# Patient Record
Sex: Female | Born: 1985 | Race: White | Hispanic: No | Marital: Married | Smoking: Never smoker
Health system: Southern US, Community
[De-identification: ages and names within clinical notes are randomized; demographics above are authoritative.]

## PROBLEM LIST (undated history)

## (undated) HISTORY — PX: ABDOMINAL HYSTERECTOMY: SHX81

---

## 2018-06-11 ENCOUNTER — Ambulatory Visit (INDEPENDENT_AMBULATORY_CARE_PROVIDER_SITE_OTHER): Payer: BLUE CROSS/BLUE SHIELD

## 2018-06-11 ENCOUNTER — Ambulatory Visit (HOSPITAL_COMMUNITY)
Admission: EM | Admit: 2018-06-11 | Discharge: 2018-06-11 | Disposition: A | Discharge status: Home / Self Care | Payer: BLUE CROSS/BLUE SHIELD | Attending: Family Medicine | Admitting: Family Medicine

## 2018-06-11 ENCOUNTER — Encounter (HOSPITAL_COMMUNITY): Payer: Self-pay | Admitting: Emergency Medicine

## 2018-06-11 DIAGNOSIS — S7001XA Contusion of right hip, initial encounter: Secondary | ICD-10-CM | POA: Diagnosis not present

## 2018-06-11 DIAGNOSIS — S42401A Unspecified fracture of lower end of right humerus, initial encounter for closed fracture: Secondary | ICD-10-CM

## 2018-06-11 DIAGNOSIS — S52124A Nondisplaced fracture of head of right radius, initial encounter for closed fracture: Secondary | ICD-10-CM | POA: Diagnosis not present

## 2018-06-11 DIAGNOSIS — S5001XA Contusion of right elbow, initial encounter: Secondary | ICD-10-CM | POA: Diagnosis not present

## 2018-06-11 DIAGNOSIS — W0110XA Fall on same level from slipping, tripping and stumbling with subsequent striking against unspecified object, initial encounter: Secondary | ICD-10-CM | POA: Diagnosis not present

## 2018-06-11 DIAGNOSIS — W19XXXA Unspecified fall, initial encounter: Secondary | ICD-10-CM

## 2018-06-11 MED ORDER — IBUPROFEN 800 MG PO TABS: 800.0000 mg | ORAL_TABLET | Freq: Three times a day (TID) | ORAL | 0 refills | Status: AC

## 2018-06-11 MED ORDER — HYDROCODONE-ACETAMINOPHEN 5-325 MG PO TABS: 1.0000 | ORAL_TABLET | ORAL | 0 refills | Status: AC | PRN

## 2018-06-11 NOTE — ED Provider Notes (Signed)
[facility]    CSN: [id number] Arrival date & time: 06/11/18  1025     History   Chief Complaint Chief Complaint  Patient presents with  . Fall    HPI Dorothy Fields is a 32 y.o. female.   HPI  Tripped and fell last night.  She was walking on uneven ground.  She landed back towards her right side.  She had a right buttock and her right elbow forcibly.  She has some bruising on her buttock and pain.  She can walk with a normal gait.  She has a severely painful elbow.  She holds it up close to her body.  She resists any movement.  This is also bruised and swollen.  No prior fractures.  No prior back problems.  History reviewed. No pertinent past medical history.  There are no active problems to display for this patient.   Past Surgical History:  Procedure Laterality Date  . ABDOMINAL HYSTERECTOMY      OB History   None      Home Medications    Prior to Admission medications   Medication Sig Start Date End Date Taking? Authorizing Provider  HYDROcodone-acetaminophen (NORCO/VICODIN) 5-325 MG tablet Take 1-2 tablets by mouth every 4 (four) hours as needed. 06/11/18   Eustace Moore, MD  ibuprofen (ADVIL,MOTRIN) 800 MG tablet Take 1 tablet (800 mg total) by mouth 3 (three) times daily. 06/11/18   Eustace Moore, MD    Family History Family History  Problem Relation Age of Onset  . Healthy Mother     Social History Social History   Tobacco Use  . Smoking status: Never Smoker  . Smokeless tobacco: Never Used  Substance Use Topics  . Alcohol use: Not Currently  . Drug use: Never     Allergies   Patient has no known allergies.   Review of Systems Review of Systems  Constitutional: Negative for chills and fever.  HENT: Negative for ear pain and sore throat.   Eyes: Negative for pain and visual disturbance.  Respiratory: Negative for cough and shortness of breath.   Cardiovascular: Negative for chest pain and palpitations.    Gastrointestinal: Negative for abdominal pain and vomiting.  Genitourinary: Negative for dysuria and hematuria.  Musculoskeletal: Positive for arthralgias and back pain.       Pain in elbow and hip from fall  Skin: Negative for color change and rash.  Neurological: Negative for seizures and syncope.  All other systems reviewed and are negative.    Physical Exam Triage Vital Signs ED Triage Vitals [06/11/18 1125]  Enc Vitals Group     BP (!) 143/94     Pulse Rate 83     Resp 18     Temp 98.2 F (36.8 C)     Temp Source Oral     SpO2 100 %     Weight      Height      Head Circumference      Peak Flow      Pain Score 8     Pain Loc      Pain Edu?      Excl. in GC?    No data found.  Updated Vital Signs BP (!) 143/94 (BP Location: Left Arm)   Pulse 83   Temp 98.2 F (36.8 C) (Oral)   Resp 18   SpO2 100%   Visual Acuity Right Eye Distance:   Left Eye Distance:   Bilateral Distance:  Right Eye Near:   Left Eye Near:    Bilateral Near:     Physical Exam  Constitutional: She appears well-developed and well-nourished. No distress.  Appears uncomfortable  HENT:  Head: Normocephalic and atraumatic.  Mouth/Throat: Oropharynx is clear and moist.  Eyes: Pupils are equal, round, and reactive to light. Conjunctivae are normal.  Neck: Normal range of motion.  Cardiovascular: Normal rate.  Pulmonary/Chest: Effort normal. No respiratory distress.  Abdominal: Soft. She exhibits no distension.  Musculoskeletal: Normal range of motion. She exhibits no edema.  Cradles right elbow close to body.  Bruising over the olecranon process.  Tenderness over the olecranon process and medial epicondyles.  Resists any movement of elbow.  She can pronate and supinate.  She allows minor movement of shoulder.  Pulses and cap refill are normal in the right hand. Bruises seen in the middle to lower buttock region right side.  No bony tenderness  Neurological: She is alert.  Skin: Skin is  warm and dry.     UC Treatments / Results  Labs (all labs ordered are listed, but only abnormal results are displayed) Labs Reviewed - No data to display  EKG None  Radiology Dg Elbow Complete Right  Result Date: 06/11/2018 CLINICAL DATA:  Patient complains of right posterior elbow pain and bruising after a fall X 1 day. EXAM: RIGHT ELBOW - COMPLETE 3+ VIEW COMPARISON:  None. FINDINGS: Osseous alignment is normal. No fracture line or displaced fracture fragment appreciated. Evidence of joint effusion. Superficial soft tissues about the elbow are unremarkable. IMPRESSION: 1. No osseous fracture or dislocation seen. 2. Evidence of joint effusion raising the possibility of occult nondisplaced fracture within the joint space. Electronically Signed   By: Bary RichardStan  Maynard M.D.   On: 06/11/2018 11:57    Procedures Procedures (including critical care time)  Medications Ordered in UC Medications - No data to display  Initial Impression / Assessment and Plan / UC Course  I have reviewed the triage vital signs and the nursing notes.  Pertinent labs & imaging results that were available during my care of the patient were reviewed by me and considered in my medical decision making (see chart for details).     I explained that with fluid around the elbow she may have an occult fracture.  The safest thing to do is to immobilize this until she can have repeat x-rays done.  She is advised to follow-up with her orthopedist in 7 to 10 days. Final Clinical Impressions(s) / UC Diagnoses   Final diagnoses:  Contusion of right elbow, initial encounter  Occult closed fracture of right elbow, initial encounter  Contusion of right hip, initial encounter  Fall, initial encounter     Discharge Instructions     Keep arm in splint and sling Use ice to reduce pain and swelling Limit activity while hip and back are painful Take ibuprofen 3 times a day with food Take the pain medication as needed in  addition.  Take when pain is severe.  Do not drive.  Pain medicine can cause drowsiness and constipation. See an orthopedist for follow-up in 7 to 10 days   ED Prescriptions    Medication Sig Dispense Auth. Provider   ibuprofen (ADVIL,MOTRIN) 800 MG tablet Take 1 tablet (800 mg total) by mouth 3 (three) times daily. 21 tablet Eustace MooreNelson, Theta Leaf Sue, MD   HYDROcodone-acetaminophen (NORCO/VICODIN) 5-325 MG tablet Take 1-2 tablets by mouth every 4 (four) hours as needed. 15 tablet Eustace MooreNelson, Delani Kohli Sue, MD  Controlled Substance Prescriptions [place] Controlled Substance Registry consulted?  No   Eustace Moore, MD 06/11/18 2117

## 2018-06-11 NOTE — ED Triage Notes (Signed)
Pt sts trip and fall last night landing on right elbow and right buttocks; pt sts pain in both areas with some bruising

## 2018-06-11 NOTE — ED Notes (Signed)
Ortho Tech returned page. 

## 2018-06-11 NOTE — Discharge Instructions (Signed)
Keep arm in splint and sling Use ice to reduce pain and swelling Limit activity while hip and back are painful Take ibuprofen 3 times a day with food Take the pain medication as needed in addition.  Take when pain is severe.  Do not drive.  Pain medicine can cause drowsiness and constipation. See an orthopedist for follow-up in 7 to 10 days

## 2018-06-11 NOTE — Progress Notes (Signed)
Orthopedic Tech Progress Note Patient Details:  Dorothy LeepKrystal Fields 1986/06/05 [id number]  Ortho Devices Type of Ortho Device: Arm sling, Post (long arm) splint Ortho Device/Splint Location: rue Ortho Device/Splint Interventions: Ordered, Application, Adjustment   Post Interventions Patient Tolerated: Well Instructions Provided: Care of device, Adjustment of device   Trinna PostMartinez, Konstantine Gervasi J 06/11/2018, 1:15 PM

## 2018-06-11 NOTE — ED Notes (Signed)
Ortho Tech paged for posterior arm splint.

## 2019-08-23 IMAGING — DX DG ELBOW COMPLETE 3+V*R*
4 series · 4 of 4 positions shown · non-contrast
Comparison: None.

CLINICAL DATA: Patient complains of right posterior elbow pain and
bruising after a fall X 1 day.

EXAM:
RIGHT ELBOW - COMPLETE 3+ VIEW

[elbow ap]
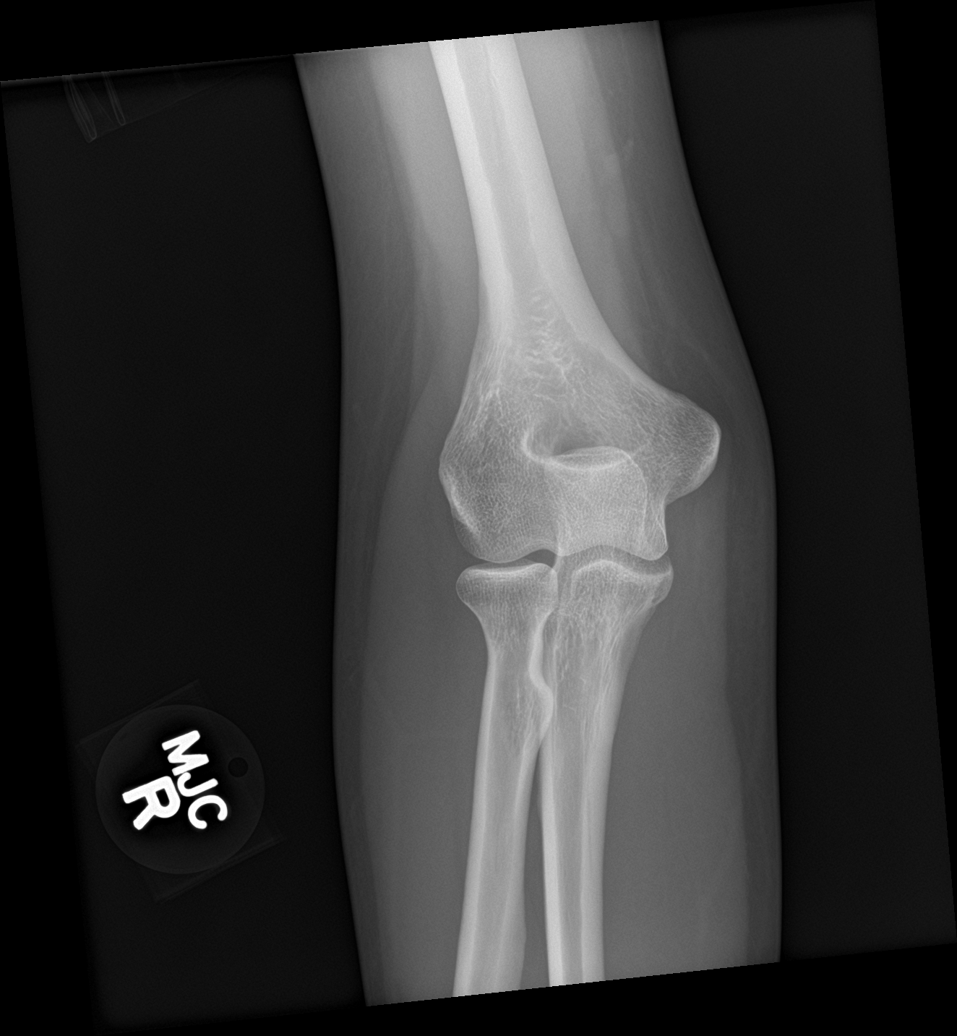

[elbow obl (1 of 2)]
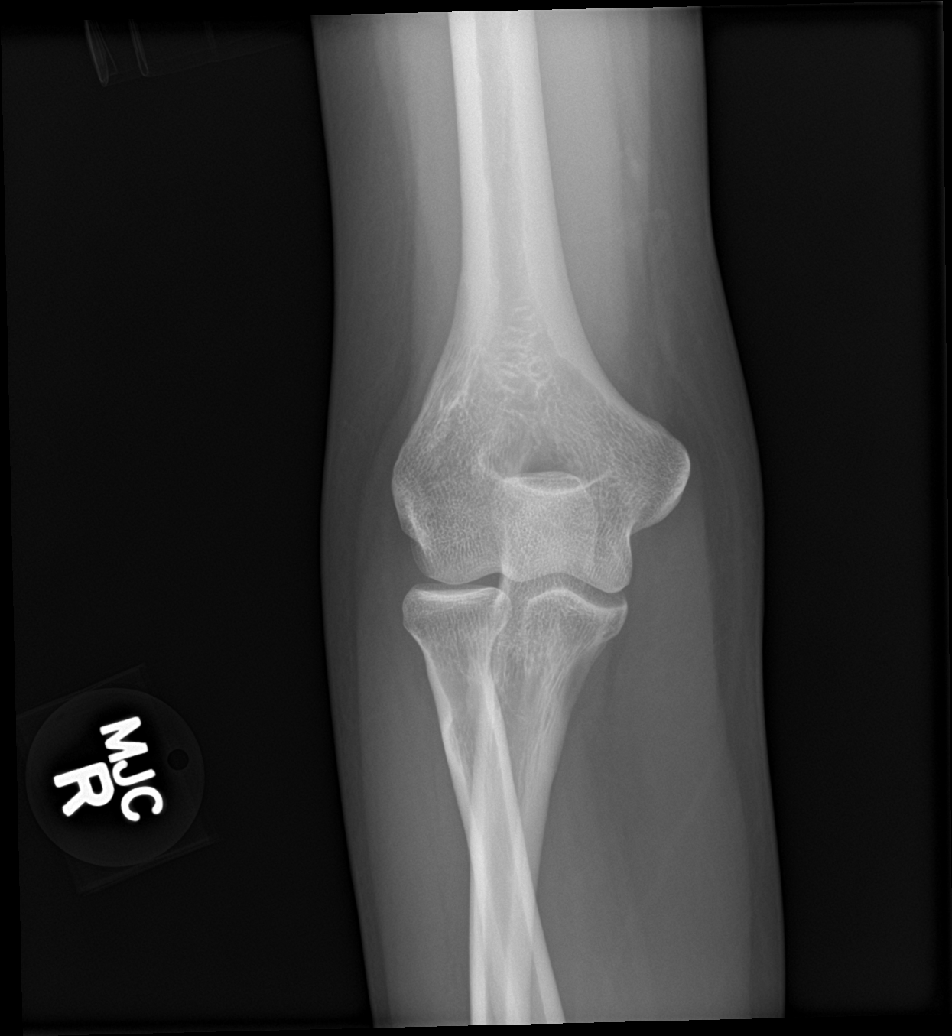

[elbow obl (2 of 2)]
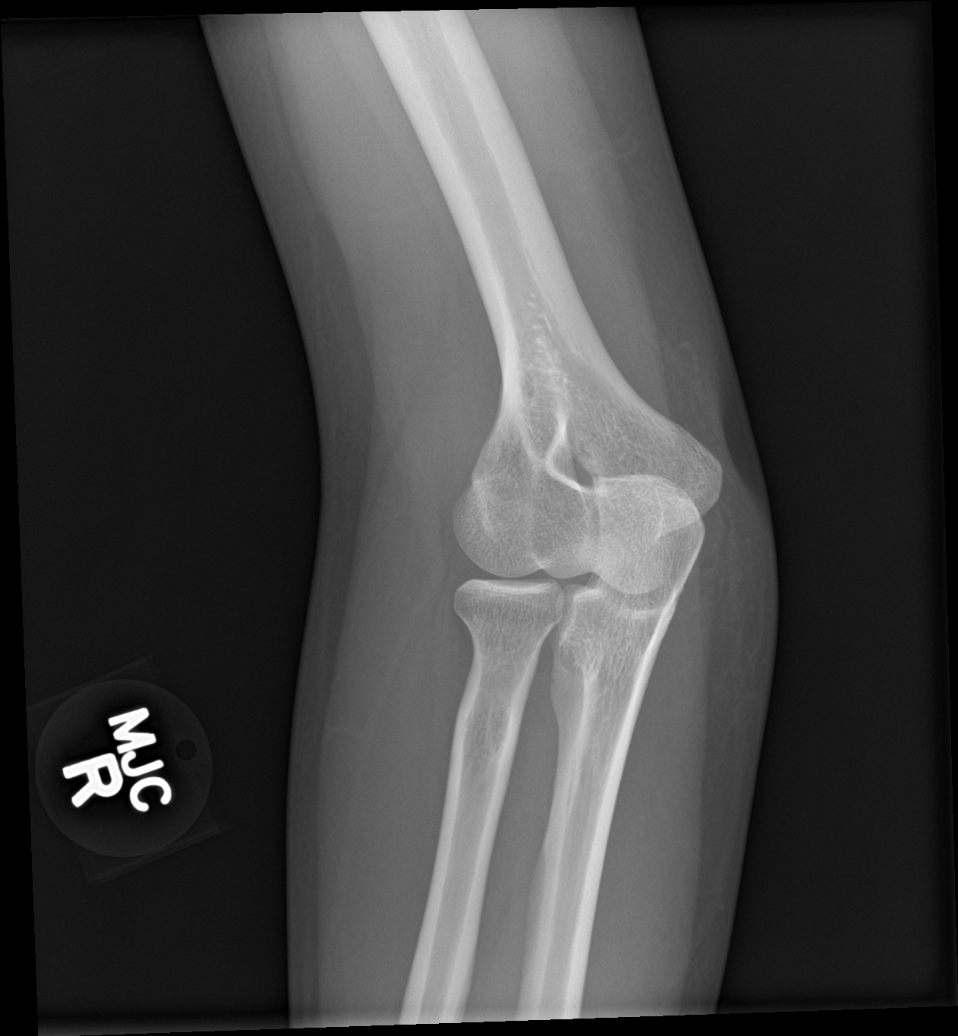

[elbow lat]
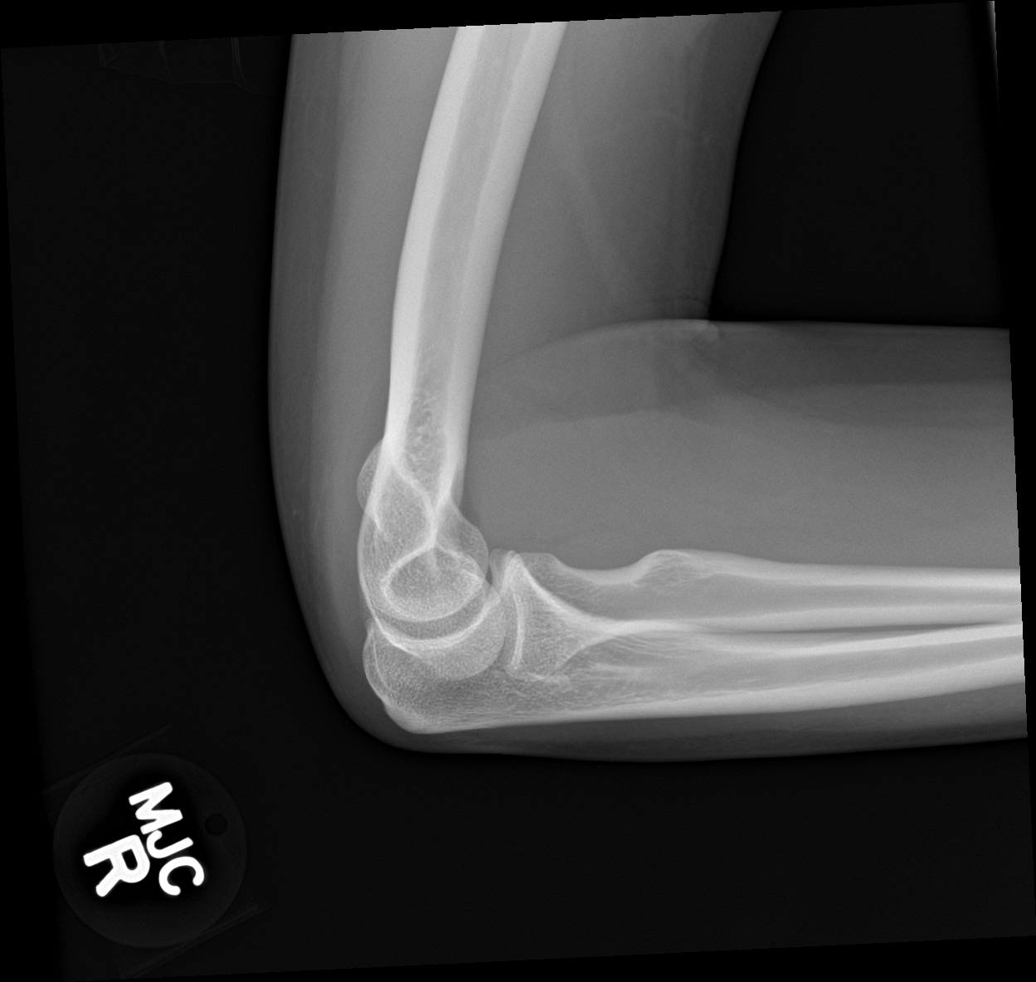

[4 of 4 positions shown; findings below may reference images not displayed]

FINDINGS: Osseous alignment is normal. No fracture line or displaced fracture
fragment appreciated. Evidence of joint effusion. Superficial soft
tissues about the elbow are unremarkable.
IMPRESSION: 1. No osseous fracture or dislocation seen.
2. Evidence of joint effusion raising the possibility of occult
nondisplaced fracture within the joint space.
